# Patient Record
Sex: Male | Born: 1995 | Race: White | Hispanic: No | Marital: Single | Smoking: Never smoker
Health system: Southern US, Community
[De-identification: ages and names within clinical notes are randomized; demographics above are authoritative.]

---
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
## 2015-08-25 IMAGING — US US ABDOMEN LIMITED
1 series · 14 of 25 positions shown · non-contrast
Comparison: None.

CLINICAL DATA: Abdominal pain.

EXAM:
US ABDOMEN LIMITED - RIGHT LOWER QUADRANT

[Series 1: us abdomen limited · 0.12mm/px · 14 of 25 slices shown]
[im 1/25]
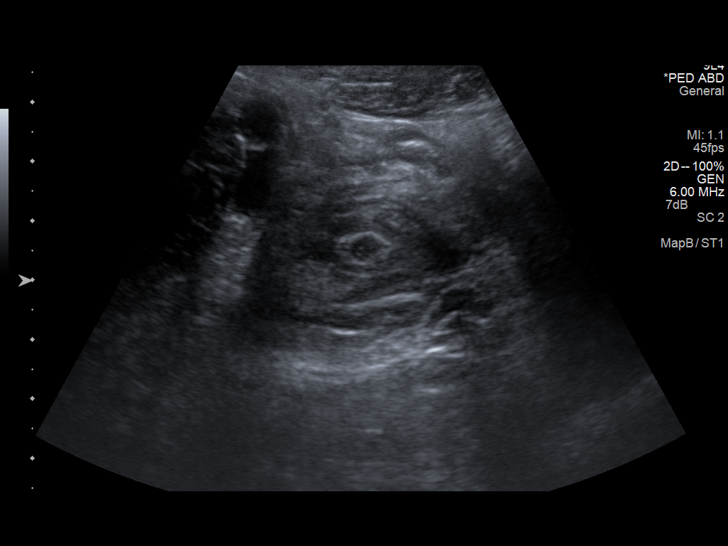
[im 3/25]
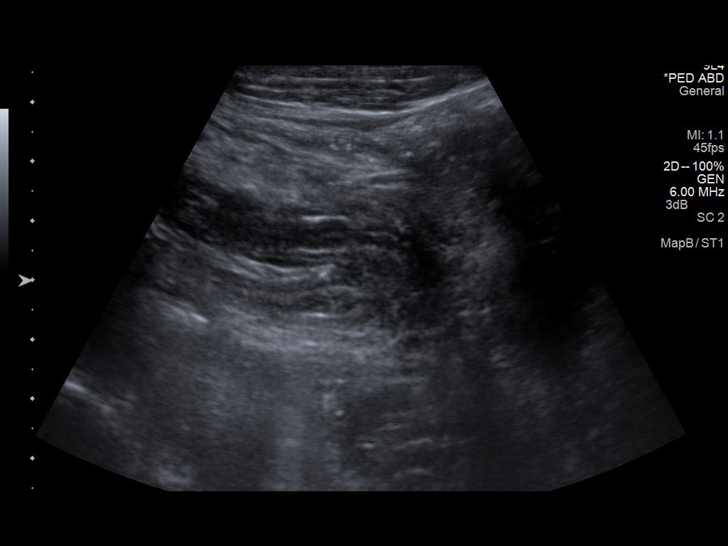
[im 5/25]
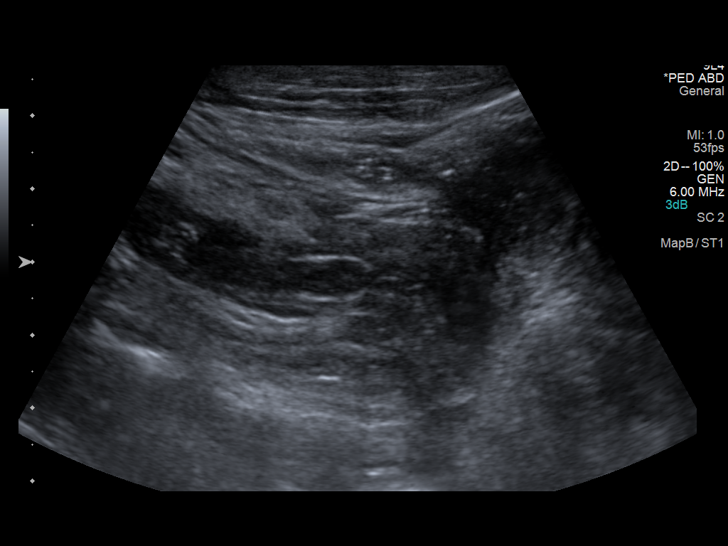
[im 7/25]
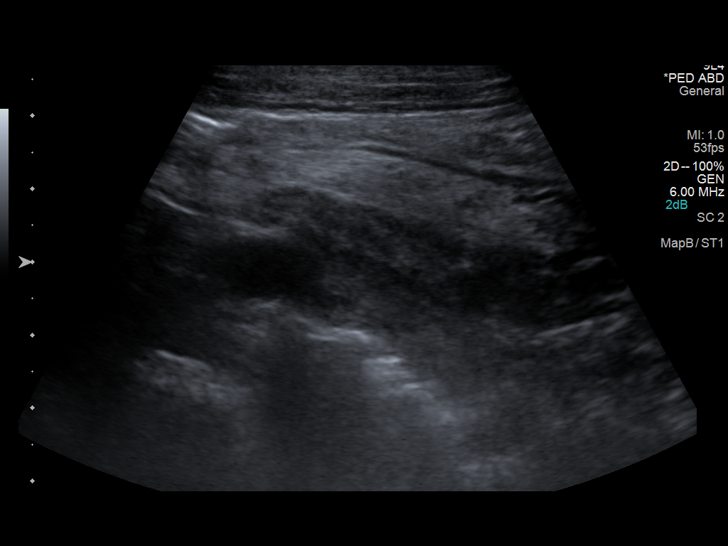
[im 9/25]
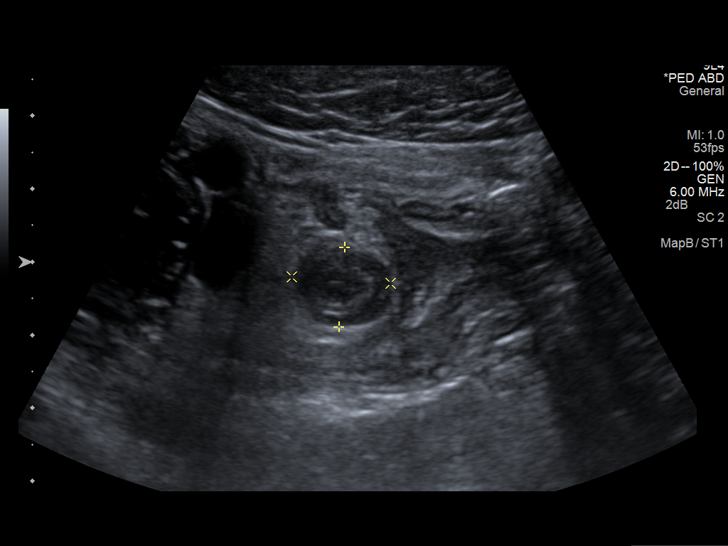
[im 10/25]
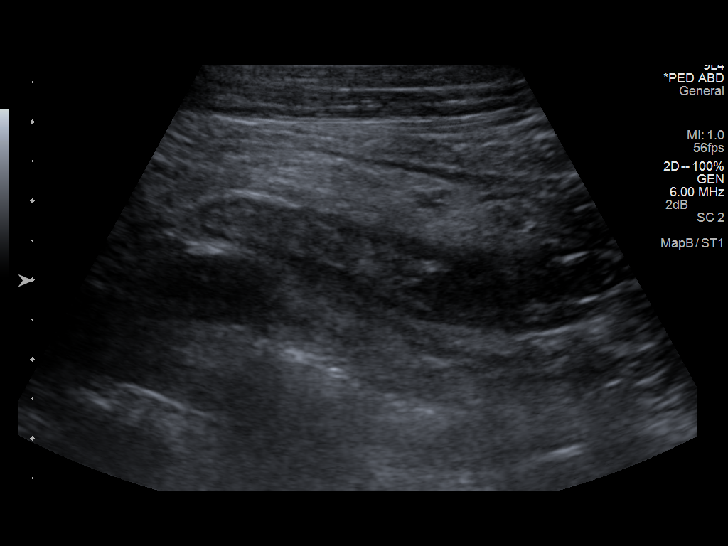
[im 12/25]
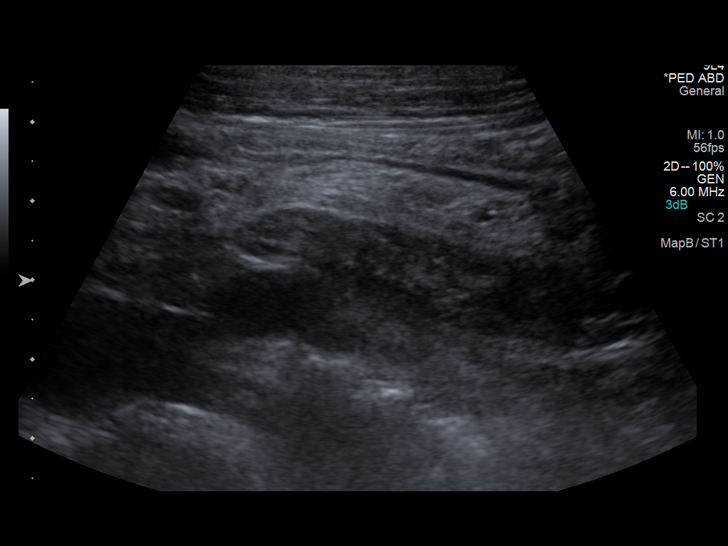
[im 14/25]
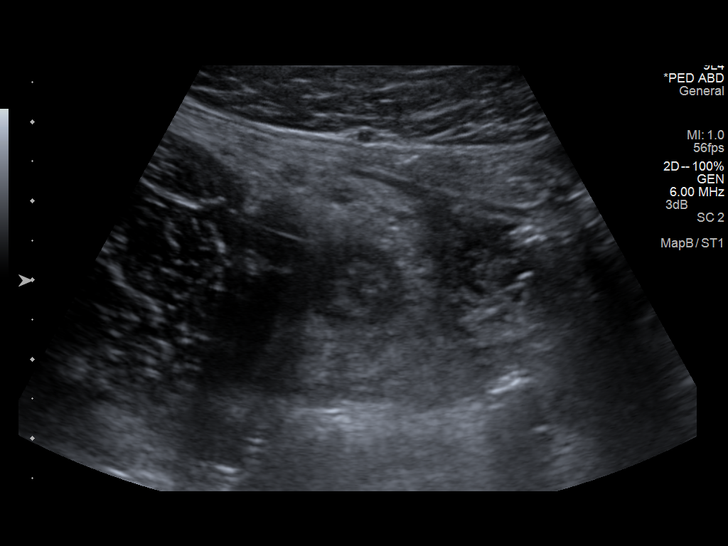
[im 16/25]
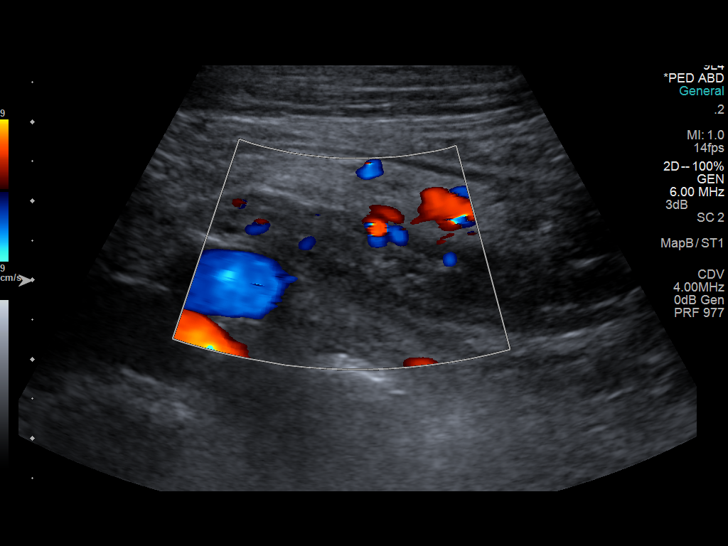
[im 17/25]
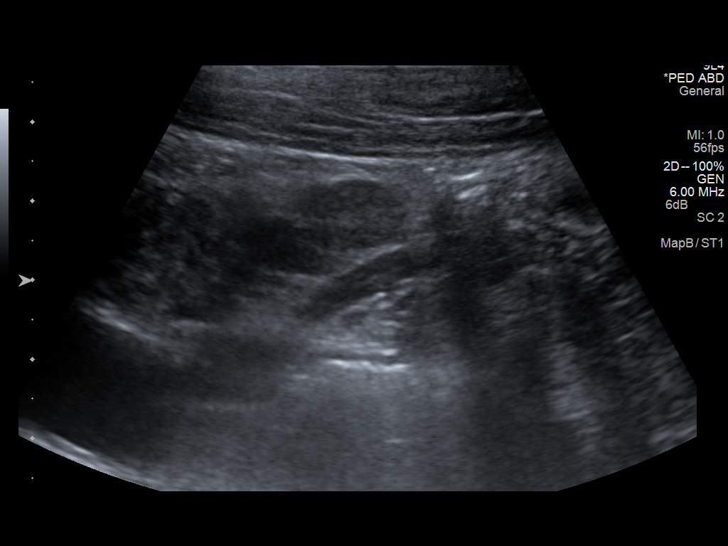
[im 19/25]
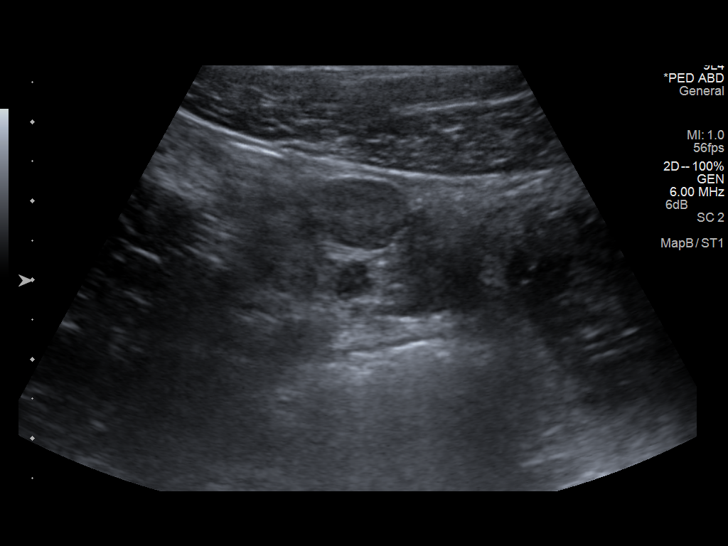
[im 21/25]
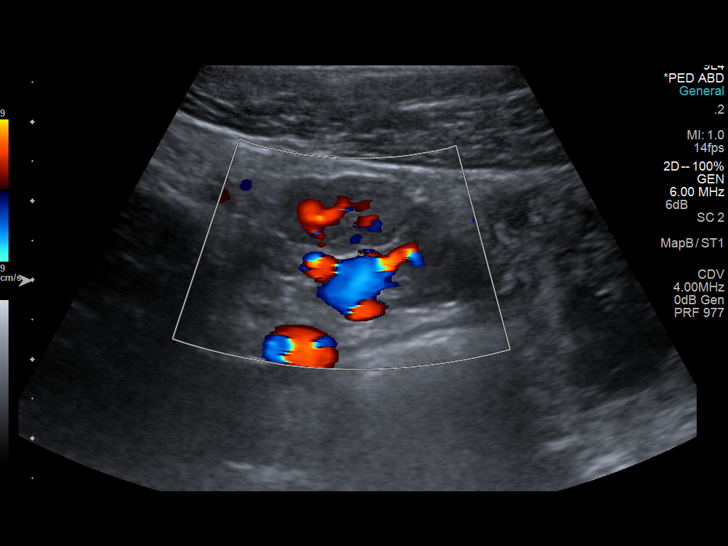
[im 23/25]
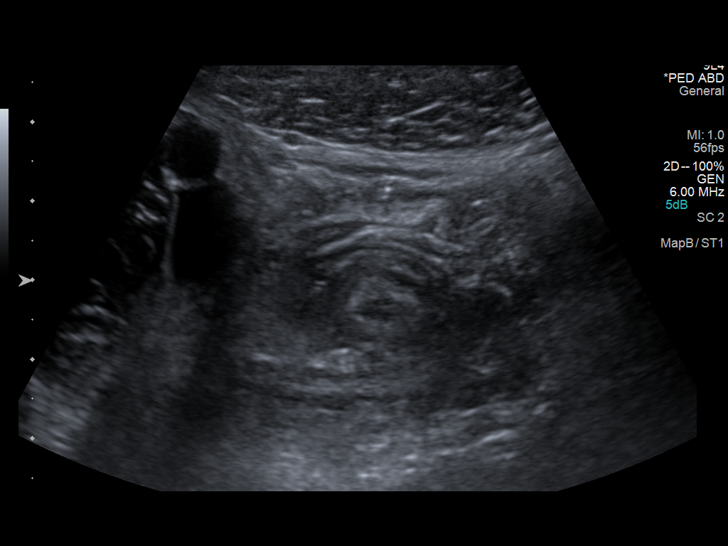
[im 25/25]
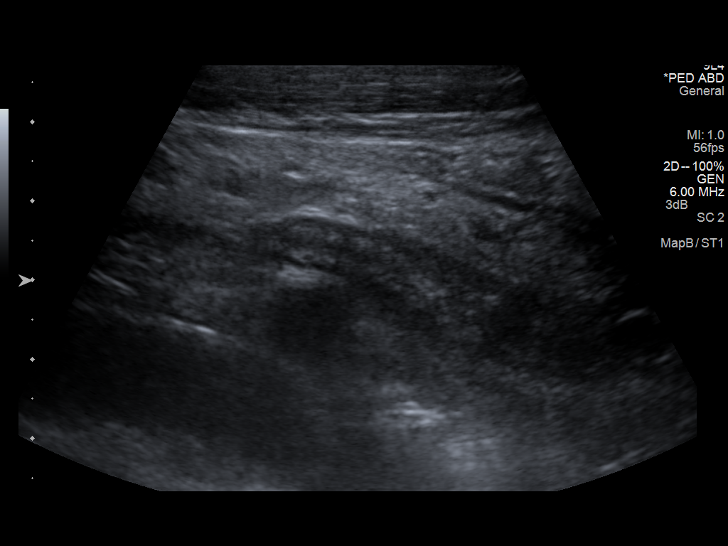

[14 of 25 positions shown; findings below may reference images not displayed]

FINDINGS: The appendix is visualized and is markedly edematous, measuring 2 cm
in diameter. There are multiple adjacent enlarged lymph nodes. No
free fluid.
IMPRESSION: Acute appendicitis.

Critical Value/emergent results were called by telephone at the time
of interpretation on 02/14/2014 at [DATE] to Dr. POL COLLIN , who
verbally acknowledged these results.

## 2015-08-25 IMAGING — CR DG OR LOCAL ABDOMEN
2 series · 2 of 2 positions shown · non-contrast
Comparison: CT 02/14/2014

CLINICAL DATA: Broken forceps during procedure. Evaluate for
retained surgical involvement

EXAM:
OR LOCAL ABDOMEN

[AP (1 of 2)]
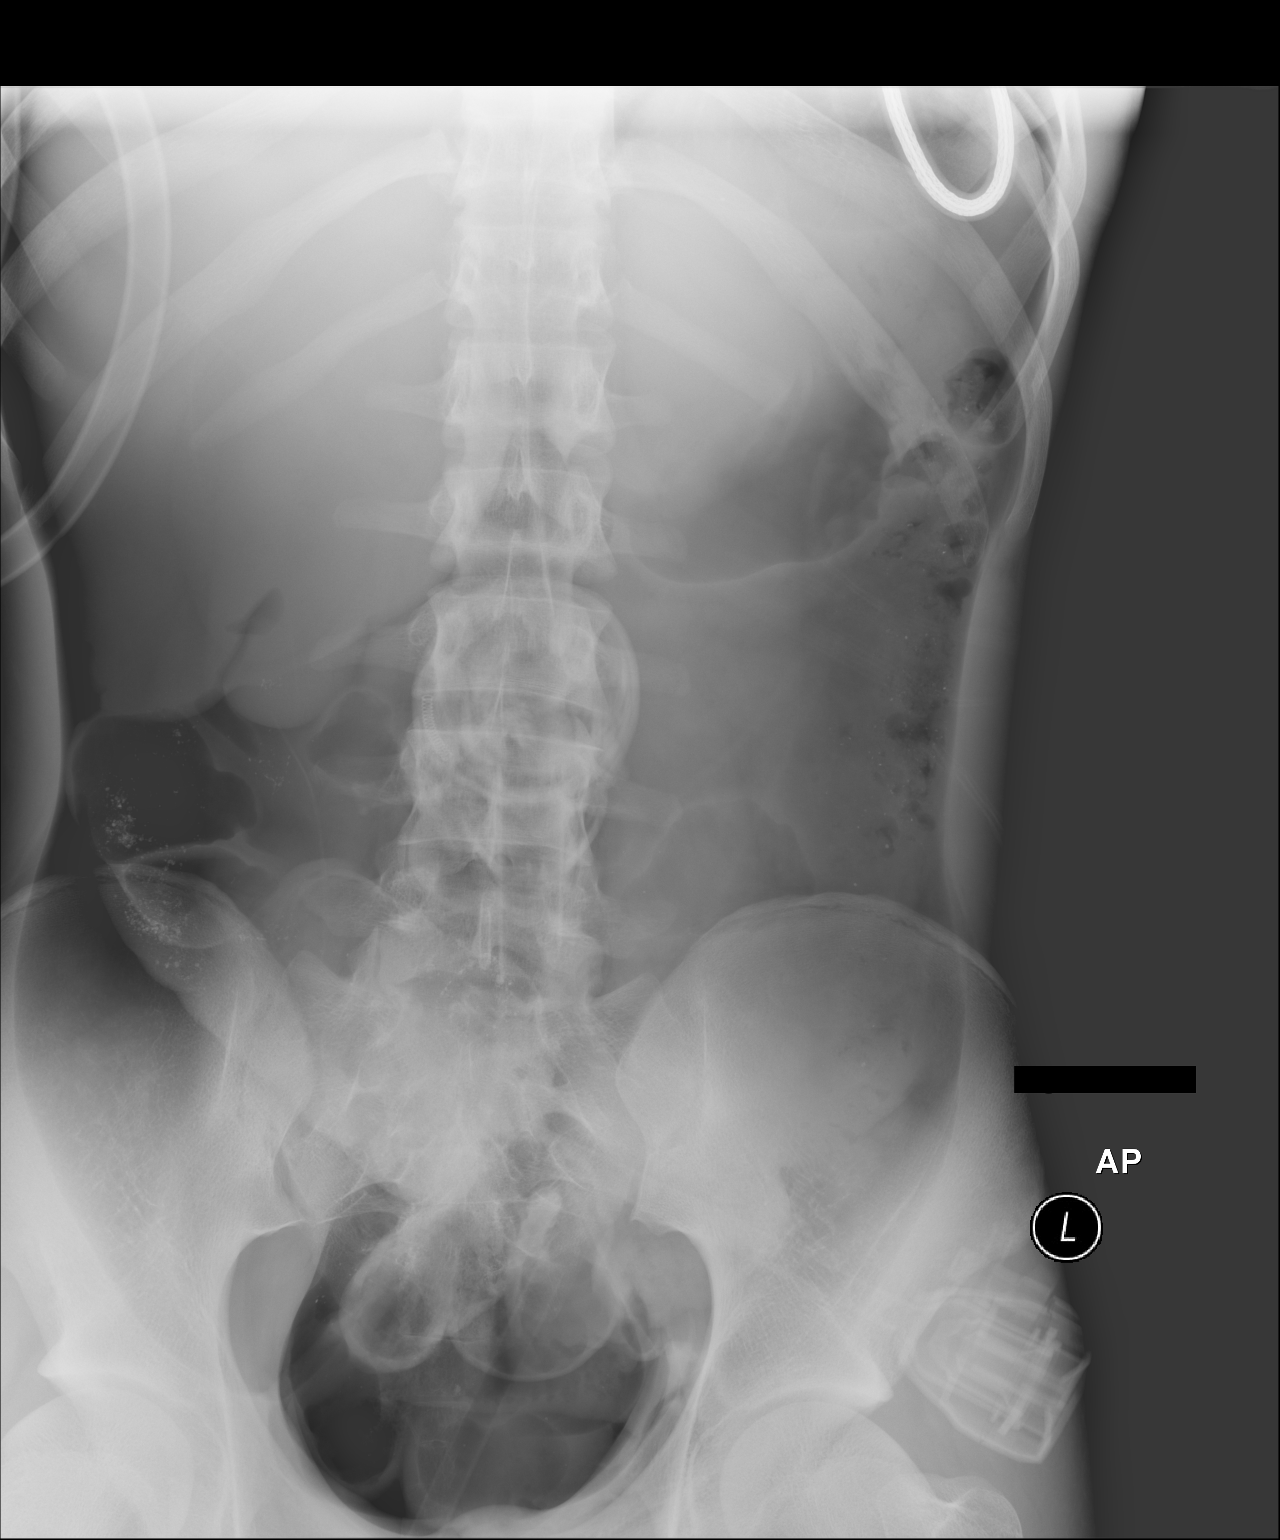

[AP (2 of 2)]
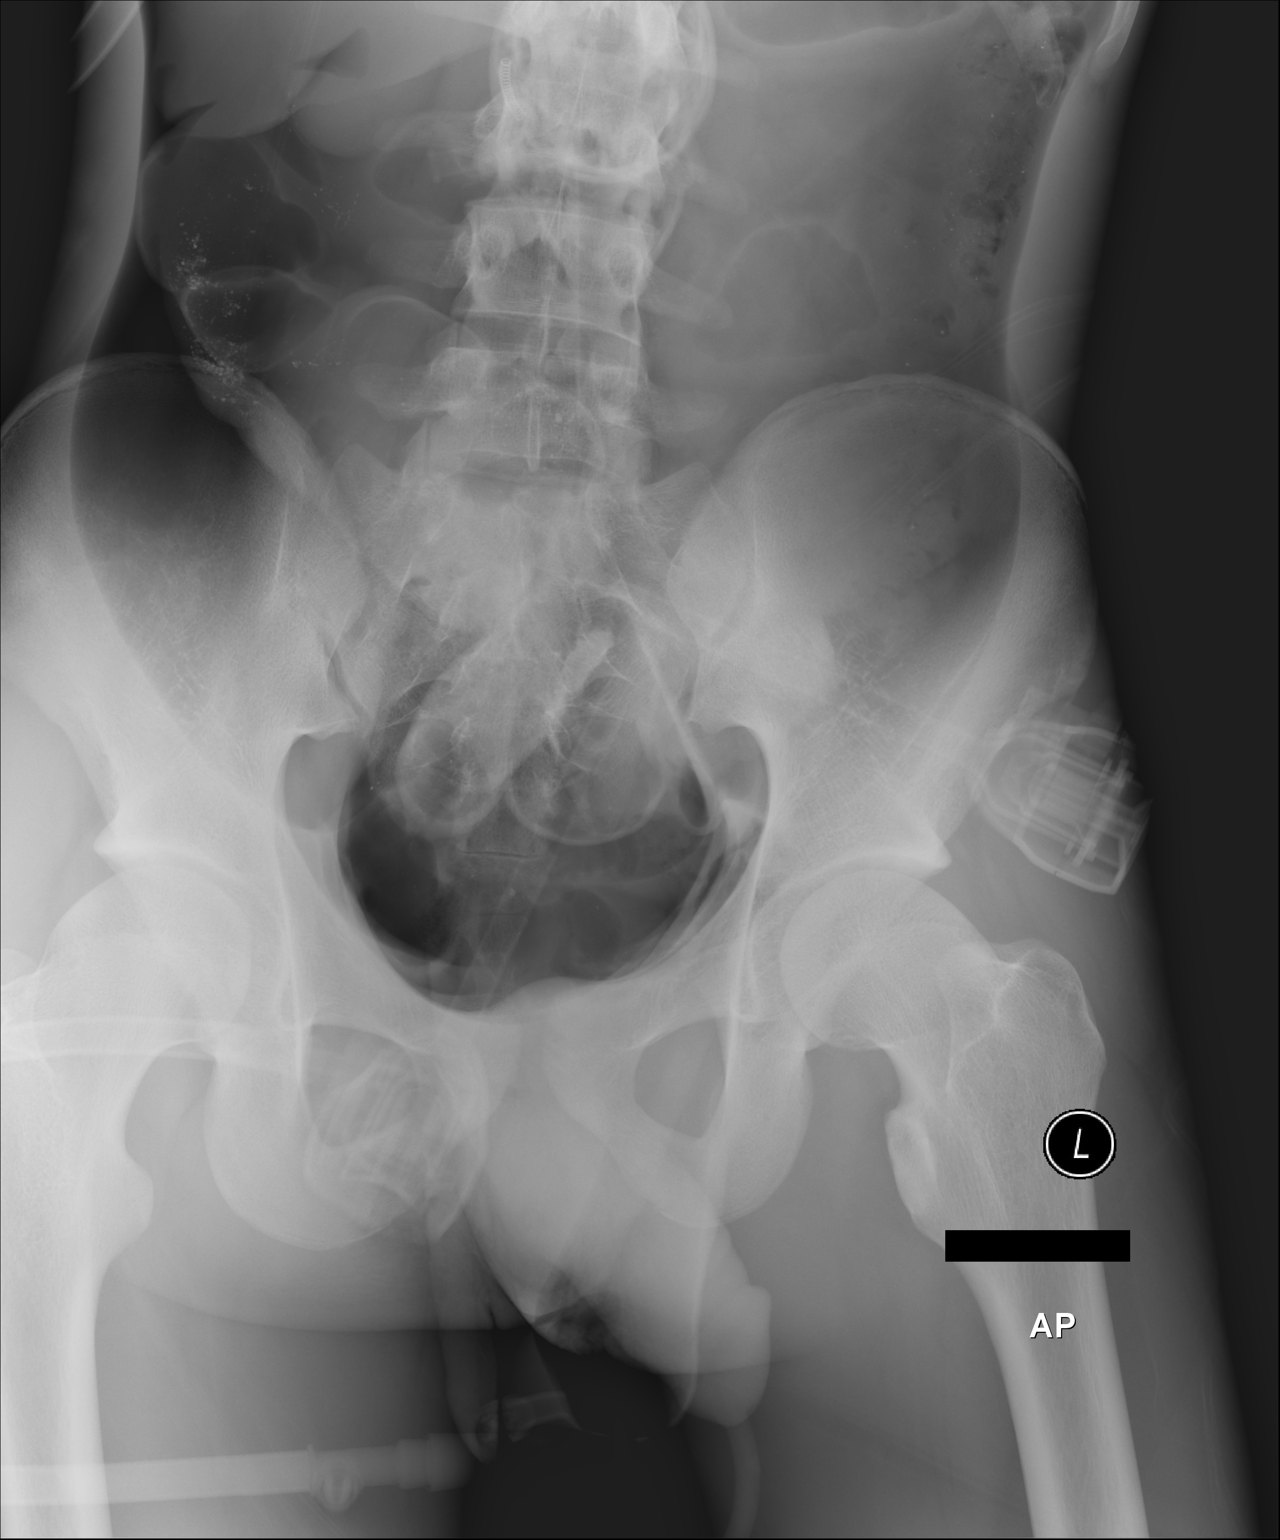

[2 of 2 positions shown; findings below may reference images not displayed]

FINDINGS: Two laparoscopes are noted in the pelvis. There is extensive gas
within the peritoneal space. There are no retained radiodense
surgical instruments.
IMPRESSION: No retained radiodense surgical instruments.
